# Patient Record
Sex: Female | Born: 1960 | Race: Black or African American | Hispanic: No
Health system: Western US, Academic
[De-identification: ages and names within clinical notes are randomized; demographics above are authoritative.]

## PROBLEM LIST (undated history)

## (undated) DIAGNOSIS — D649 Anemia, unspecified: Secondary | ICD-10-CM

## (undated) DIAGNOSIS — L209 Atopic dermatitis, unspecified: Secondary | ICD-10-CM

## (undated) DIAGNOSIS — K219 Gastro-esophageal reflux disease without esophagitis: Secondary | ICD-10-CM

## (undated) DIAGNOSIS — I10 Essential (primary) hypertension: Secondary | ICD-10-CM

## (undated) HISTORY — PX: TONSILLECTOMY: SUR1361

## (undated) HISTORY — DX: Gastro-esophageal reflux disease without esophagitis: K21.9

## (undated) HISTORY — DX: Anemia, unspecified: D64.9

## (undated) HISTORY — PX: COLONOSCOPY: SHX174

## (undated) HISTORY — DX: Essential (primary) hypertension: I10

## (undated) HISTORY — DX: Atopic dermatitis, unspecified: L20.9

## (undated) DEATH — deceased

---

## 1993-05-21 ENCOUNTER — Other Ambulatory Visit (HOSPITAL_BASED_OUTPATIENT_CLINIC_OR_DEPARTMENT_OTHER): Payer: Self-pay

## 1993-05-27 LAB — CERVICAL CANCER SCREENING

## 2001-05-22 ENCOUNTER — Ambulatory Visit (INDEPENDENT_AMBULATORY_CARE_PROVIDER_SITE_OTHER): Payer: Medicaid Other | Admitting: Oral and Maxillofacial Surgery

## 2001-06-06 ENCOUNTER — Encounter (INDEPENDENT_AMBULATORY_CARE_PROVIDER_SITE_OTHER): Payer: Medicaid Other

## 2001-10-26 ENCOUNTER — Encounter (HOSPITAL_BASED_OUTPATIENT_CLINIC_OR_DEPARTMENT_OTHER): Payer: Self-pay

## 2001-10-30 ENCOUNTER — Encounter (HOSPITAL_BASED_OUTPATIENT_CLINIC_OR_DEPARTMENT_OTHER): Payer: MEDICAID

## 2002-01-23 ENCOUNTER — Encounter (HOSPITAL_BASED_OUTPATIENT_CLINIC_OR_DEPARTMENT_OTHER): Payer: Self-pay | Admitting: Family

## 2003-05-21 ENCOUNTER — Ambulatory Visit (EMERGENCY_DEPARTMENT_HOSPITAL): Payer: No Typology Code available for payment source

## 2005-07-29 ENCOUNTER — Ambulatory Visit (EMERGENCY_DEPARTMENT_HOSPITAL): Payer: Medicaid Other

## 2005-08-15 ENCOUNTER — Ambulatory Visit (EMERGENCY_DEPARTMENT_HOSPITAL): Payer: Medicaid Other

## 2005-09-28 ENCOUNTER — Encounter (HOSPITAL_BASED_OUTPATIENT_CLINIC_OR_DEPARTMENT_OTHER): Payer: Medicaid Other | Admitting: Nurse Practitioner

## 2005-10-06 ENCOUNTER — Encounter (HOSPITAL_BASED_OUTPATIENT_CLINIC_OR_DEPARTMENT_OTHER): Payer: Medicaid Other | Admitting: Nurse Practitioner

## 2005-10-07 ENCOUNTER — Encounter (HOSPITAL_BASED_OUTPATIENT_CLINIC_OR_DEPARTMENT_OTHER): Payer: Self-pay | Admitting: Physician Assistant

## 2005-10-07 ENCOUNTER — Encounter (HOSPITAL_BASED_OUTPATIENT_CLINIC_OR_DEPARTMENT_OTHER): Payer: Self-pay | Admitting: Nurse Practitioner

## 2006-03-31 ENCOUNTER — Ambulatory Visit (EMERGENCY_DEPARTMENT_HOSPITAL)

## 2006-06-27 ENCOUNTER — Encounter (HOSPITAL_BASED_OUTPATIENT_CLINIC_OR_DEPARTMENT_OTHER): Payer: Medicaid Other | Admitting: Internal Medicine

## 2006-07-08 ENCOUNTER — Encounter (HOSPITAL_BASED_OUTPATIENT_CLINIC_OR_DEPARTMENT_OTHER): Payer: Self-pay | Admitting: Clinical

## 2006-07-26 ENCOUNTER — Ambulatory Visit (HOSPITAL_BASED_OUTPATIENT_CLINIC_OR_DEPARTMENT_OTHER): Payer: Medicaid Other | Admitting: Rehabilitative and Restorative Service Providers"

## 2006-08-02 ENCOUNTER — Encounter (HOSPITAL_BASED_OUTPATIENT_CLINIC_OR_DEPARTMENT_OTHER): Payer: Medicaid Other | Admitting: Rehabilitative and Restorative Service Providers"

## 2006-08-08 ENCOUNTER — Encounter (HOSPITAL_BASED_OUTPATIENT_CLINIC_OR_DEPARTMENT_OTHER): Payer: Medicaid Other | Admitting: Rehabilitative and Restorative Service Providers"

## 2006-08-17 ENCOUNTER — Encounter (HOSPITAL_BASED_OUTPATIENT_CLINIC_OR_DEPARTMENT_OTHER): Payer: Medicaid Other | Admitting: Rehabilitative and Restorative Service Providers"

## 2006-08-25 ENCOUNTER — Encounter (HOSPITAL_BASED_OUTPATIENT_CLINIC_OR_DEPARTMENT_OTHER): Payer: Medicaid Other | Admitting: Rehabilitative and Restorative Service Providers"

## 2006-09-07 ENCOUNTER — Encounter (HOSPITAL_BASED_OUTPATIENT_CLINIC_OR_DEPARTMENT_OTHER): Payer: Medicaid Other | Admitting: Internal Medicine

## 2006-10-28 ENCOUNTER — Encounter (HOSPITAL_BASED_OUTPATIENT_CLINIC_OR_DEPARTMENT_OTHER): Payer: Medicaid Other

## 2006-11-04 ENCOUNTER — Encounter (HOSPITAL_BASED_OUTPATIENT_CLINIC_OR_DEPARTMENT_OTHER): Payer: Self-pay | Admitting: Internal Medicine

## 2007-01-24 ENCOUNTER — Encounter (HOSPITAL_BASED_OUTPATIENT_CLINIC_OR_DEPARTMENT_OTHER): Payer: Medicaid Other | Admitting: Internal Medicine

## 2007-06-05 ENCOUNTER — Encounter (HOSPITAL_BASED_OUTPATIENT_CLINIC_OR_DEPARTMENT_OTHER): Payer: Medicaid Other | Admitting: Dermatology

## 2007-10-02 ENCOUNTER — Encounter (HOSPITAL_BASED_OUTPATIENT_CLINIC_OR_DEPARTMENT_OTHER): Payer: Medicaid Other | Admitting: Dermatology

## 2009-04-06 ENCOUNTER — Inpatient Hospital Stay (HOSPITAL_BASED_OUTPATIENT_CLINIC_OR_DEPARTMENT_OTHER): Payer: Medicaid Other

## 2009-04-06 ENCOUNTER — Inpatient Hospital Stay
Admission: EM | Admit: 2009-04-06 | Discharge: 2009-05-05 | Disposition: A | Discharge status: Other Acute Inpatient Hospital | Payer: Medicaid Other | Source: Other Acute Inpatient Hospital | Attending: Thoracic Surgery (Cardiothoracic Vascular Surgery) | Admitting: Thoracic Surgery (Cardiothoracic Vascular Surgery)

## 2009-04-06 ENCOUNTER — Inpatient Hospital Stay (HOSPITAL_COMMUNITY): Payer: Medicaid Other | Admitting: Cardiac Surgery

## 2009-04-06 DIAGNOSIS — I76 Septic arterial embolism: Secondary | ICD-10-CM | POA: Diagnosis not present

## 2009-04-06 DIAGNOSIS — J690 Pneumonitis due to inhalation of food and vomit: Secondary | ICD-10-CM | POA: Diagnosis not present

## 2009-04-06 DIAGNOSIS — I311 Chronic constrictive pericarditis: Secondary | ICD-10-CM | POA: Diagnosis present

## 2009-04-06 DIAGNOSIS — D62 Acute posthemorrhagic anemia: Secondary | ICD-10-CM | POA: Diagnosis not present

## 2009-04-06 DIAGNOSIS — I10 Essential (primary) hypertension: Secondary | ICD-10-CM | POA: Diagnosis present

## 2009-04-06 DIAGNOSIS — Z880 Allergy status to penicillin: Secondary | ICD-10-CM

## 2009-04-06 DIAGNOSIS — I319 Disease of pericardium, unspecified: Secondary | ICD-10-CM | POA: Diagnosis present

## 2009-04-06 DIAGNOSIS — J9600 Acute respiratory failure, unspecified whether with hypoxia or hypercapnia: Secondary | ICD-10-CM | POA: Diagnosis not present

## 2009-04-06 DIAGNOSIS — Z9911 Dependence on respirator [ventilator] status: Secondary | ICD-10-CM

## 2009-04-06 DIAGNOSIS — E46 Unspecified protein-calorie malnutrition: Secondary | ICD-10-CM | POA: Diagnosis present

## 2009-04-06 DIAGNOSIS — I776 Arteritis, unspecified: Secondary | ICD-10-CM | POA: Diagnosis present

## 2009-04-06 DIAGNOSIS — IMO0002 Reserved for concepts with insufficient information to code with codable children: Secondary | ICD-10-CM | POA: Diagnosis present

## 2009-04-06 DIAGNOSIS — F411 Generalized anxiety disorder: Secondary | ICD-10-CM | POA: Diagnosis present

## 2009-04-06 DIAGNOSIS — R091 Pleurisy: Secondary | ICD-10-CM | POA: Diagnosis present

## 2009-04-06 DIAGNOSIS — I308 Other forms of acute pericarditis: Secondary | ICD-10-CM | POA: Diagnosis present

## 2009-04-06 DIAGNOSIS — I71019 Dissection of thoracic aorta, unspecified: Principal | ICD-10-CM | POA: Diagnosis present

## 2009-04-06 DIAGNOSIS — F10931 Alcohol use, unspecified with withdrawal delirium: Secondary | ICD-10-CM | POA: Diagnosis present

## 2009-04-06 DIAGNOSIS — R0682 Tachypnea, not elsewhere classified: Secondary | ICD-10-CM | POA: Diagnosis not present

## 2009-04-06 DIAGNOSIS — I9589 Other hypotension: Secondary | ICD-10-CM | POA: Diagnosis not present

## 2009-04-08 ENCOUNTER — Inpatient Hospital Stay (HOSPITAL_BASED_OUTPATIENT_CLINIC_OR_DEPARTMENT_OTHER): Payer: Medicaid Other

## 2009-04-08 ENCOUNTER — Other Ambulatory Visit (HOSPITAL_BASED_OUTPATIENT_CLINIC_OR_DEPARTMENT_OTHER): Payer: Medicaid Other

## 2009-04-11 ENCOUNTER — Inpatient Hospital Stay (HOSPITAL_BASED_OUTPATIENT_CLINIC_OR_DEPARTMENT_OTHER): Payer: Medicaid Other

## 2009-04-11 ENCOUNTER — Other Ambulatory Visit (HOSPITAL_BASED_OUTPATIENT_CLINIC_OR_DEPARTMENT_OTHER): Payer: Self-pay | Admitting: Cardiac Surgery

## 2009-04-15 ENCOUNTER — Other Ambulatory Visit (HOSPITAL_BASED_OUTPATIENT_CLINIC_OR_DEPARTMENT_OTHER): Payer: Self-pay | Admitting: Cardiac Surgery

## 2009-04-17 ENCOUNTER — Other Ambulatory Visit (HOSPITAL_BASED_OUTPATIENT_CLINIC_OR_DEPARTMENT_OTHER): Payer: Self-pay | Admitting: Plastic and Reconstructive Surgery

## 2009-04-17 LAB — CHEST X-RAY 1 VW

## 2009-04-23 ENCOUNTER — Ambulatory Visit (HOSPITAL_BASED_OUTPATIENT_CLINIC_OR_DEPARTMENT_OTHER): Payer: Medicaid Other

## 2009-04-23 ENCOUNTER — Other Ambulatory Visit (HOSPITAL_BASED_OUTPATIENT_CLINIC_OR_DEPARTMENT_OTHER): Payer: Self-pay | Admitting: Plastic and Reconstructive Surgery

## 2009-04-24 ENCOUNTER — Inpatient Hospital Stay (HOSPITAL_BASED_OUTPATIENT_CLINIC_OR_DEPARTMENT_OTHER): Payer: Medicaid Other

## 2009-04-25 ENCOUNTER — Inpatient Hospital Stay (HOSPITAL_BASED_OUTPATIENT_CLINIC_OR_DEPARTMENT_OTHER): Payer: Medicaid Other

## 2009-04-29 ENCOUNTER — Inpatient Hospital Stay (HOSPITAL_BASED_OUTPATIENT_CLINIC_OR_DEPARTMENT_OTHER): Payer: Medicaid Other

## 2009-04-29 LAB — PR ANGIO DRAIN PLACEMENT

## 2009-04-30 ENCOUNTER — Inpatient Hospital Stay (HOSPITAL_BASED_OUTPATIENT_CLINIC_OR_DEPARTMENT_OTHER): Payer: Medicaid Other

## 2009-07-01 ENCOUNTER — Other Ambulatory Visit (HOSPITAL_BASED_OUTPATIENT_CLINIC_OR_DEPARTMENT_OTHER): Payer: Self-pay | Admitting: Thoracic Surgery (Cardiothoracic Vascular Surgery)

## 2009-07-01 ENCOUNTER — Ambulatory Visit: Payer: Medicaid Other | Attending: Thoracic Surgery (Cardiothoracic Vascular Surgery)

## 2009-07-01 DIAGNOSIS — I712 Thoracic aortic aneurysm, without rupture, unspecified: Secondary | ICD-10-CM | POA: Insufficient documentation

## 2009-07-02 ENCOUNTER — Encounter (HOSPITAL_BASED_OUTPATIENT_CLINIC_OR_DEPARTMENT_OTHER): Payer: Medicaid Other | Admitting: Thoracic Surgery (Cardiothoracic Vascular Surgery)

## 2009-07-02 ENCOUNTER — Ambulatory Visit
Payer: Medicaid Other | Attending: Thoracic Surgery (Cardiothoracic Vascular Surgery) | Admitting: Thoracic Surgery (Cardiothoracic Vascular Surgery)

## 2009-07-02 DIAGNOSIS — I712 Thoracic aortic aneurysm, without rupture, unspecified: Secondary | ICD-10-CM | POA: Insufficient documentation

## 2009-07-02 DIAGNOSIS — Z48812 Encounter for surgical aftercare following surgery on the circulatory system: Secondary | ICD-10-CM | POA: Insufficient documentation

## 2009-07-21 LAB — PR CT ANGIOGRAPHY CHEST W/CONTRAST/NONCONTRAST

## 2010-02-09 ENCOUNTER — Ambulatory Visit: Payer: Self-pay | Admitting: Internal Medicine

## 2010-11-09 ENCOUNTER — Ambulatory Visit: Payer: Medicaid Other

## 2010-11-11 ENCOUNTER — Encounter (HOSPITAL_BASED_OUTPATIENT_CLINIC_OR_DEPARTMENT_OTHER): Payer: Self-pay | Admitting: Thoracic Surgery (Cardiothoracic Vascular Surgery)

## 2010-11-27 ENCOUNTER — Other Ambulatory Visit (HOSPITAL_BASED_OUTPATIENT_CLINIC_OR_DEPARTMENT_OTHER): Payer: Medicaid Other

## 2010-12-02 ENCOUNTER — Encounter (HOSPITAL_BASED_OUTPATIENT_CLINIC_OR_DEPARTMENT_OTHER): Payer: Medicaid Other | Admitting: Thoracic Surgery (Cardiothoracic Vascular Surgery)

## 2010-12-16 ENCOUNTER — Other Ambulatory Visit (HOSPITAL_BASED_OUTPATIENT_CLINIC_OR_DEPARTMENT_OTHER): Payer: Medicaid Other

## 2010-12-18 ENCOUNTER — Other Ambulatory Visit (HOSPITAL_BASED_OUTPATIENT_CLINIC_OR_DEPARTMENT_OTHER): Payer: Medicaid Other

## 2010-12-25 ENCOUNTER — Other Ambulatory Visit (HOSPITAL_BASED_OUTPATIENT_CLINIC_OR_DEPARTMENT_OTHER): Payer: Self-pay | Admitting: Thoracic Surgery (Cardiothoracic Vascular Surgery)

## 2010-12-25 ENCOUNTER — Ambulatory Visit: Payer: Medicaid Other | Attending: Thoracic Surgery (Cardiothoracic Vascular Surgery)

## 2010-12-25 DIAGNOSIS — I719 Aortic aneurysm of unspecified site, without rupture: Secondary | ICD-10-CM

## 2010-12-25 LAB — PR MRA CHEST WO/W CONT

## 2010-12-30 ENCOUNTER — Encounter (HOSPITAL_BASED_OUTPATIENT_CLINIC_OR_DEPARTMENT_OTHER): Payer: Self-pay | Admitting: Thoracic Surgery (Cardiothoracic Vascular Surgery)

## 2011-01-06 ENCOUNTER — Encounter (HOSPITAL_BASED_OUTPATIENT_CLINIC_OR_DEPARTMENT_OTHER): Payer: Self-pay | Admitting: Thoracic Surgery (Cardiothoracic Vascular Surgery)

## 2011-01-20 ENCOUNTER — Ambulatory Visit
Payer: Medicaid Other | Attending: Thoracic Surgery (Cardiothoracic Vascular Surgery) | Admitting: Thoracic Surgery (Cardiothoracic Vascular Surgery)

## 2011-01-20 DIAGNOSIS — I712 Thoracic aortic aneurysm, without rupture, unspecified: Secondary | ICD-10-CM | POA: Insufficient documentation

## 2011-07-20 ENCOUNTER — Other Ambulatory Visit: Payer: Self-pay

## 2012-02-02 ENCOUNTER — Other Ambulatory Visit (HOSPITAL_BASED_OUTPATIENT_CLINIC_OR_DEPARTMENT_OTHER): Payer: Medicaid Other

## 2012-02-02 ENCOUNTER — Encounter (HOSPITAL_BASED_OUTPATIENT_CLINIC_OR_DEPARTMENT_OTHER): Payer: Medicaid Other | Admitting: Thoracic Surgery (Cardiothoracic Vascular Surgery)

## 2012-05-24 ENCOUNTER — Ambulatory Visit: Payer: Self-pay | Admitting: Family Medicine

## 2014-05-31 ENCOUNTER — Encounter (HOSPITAL_BASED_OUTPATIENT_CLINIC_OR_DEPARTMENT_OTHER): Payer: Self-pay | Admitting: Thoracic Surgery (Cardiothoracic Vascular Surgery)

## 2014-05-31 NOTE — Telephone Encounter (Signed)
This encounter was opened in error.

## 2014-08-29 ENCOUNTER — Ambulatory Visit: Payer: Self-pay | Admitting: Family Medicine

## 2015-07-14 IMAGING — US ABDOMEN ULTRASOUND
1 series · 14 of 25 positions shown · non-contrast
Comparison: None.

CLINICAL DATA: Right upper quadrant pain.  Nausea.

EXAM:
ULTRASOUND ABDOMEN COMPLETE

[Series 1: abdomen ultrasound · 0.30mm/px · 14 of 101 slices shown]
[im 1/101]
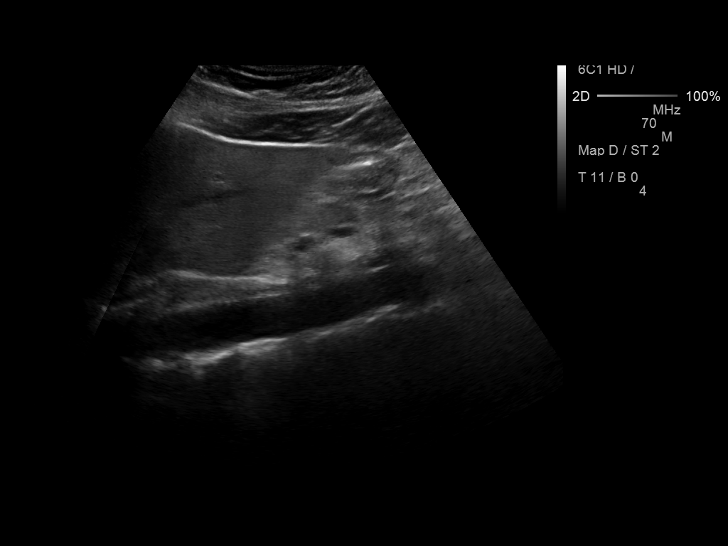
[im 9/101]
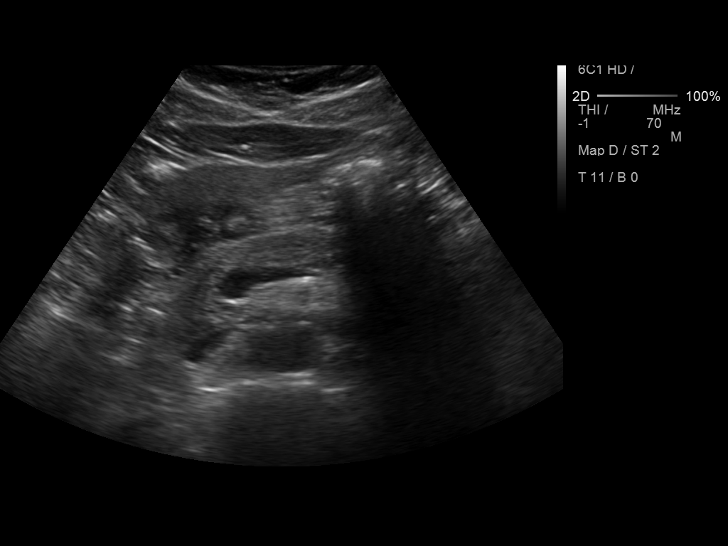
[im 17/101]
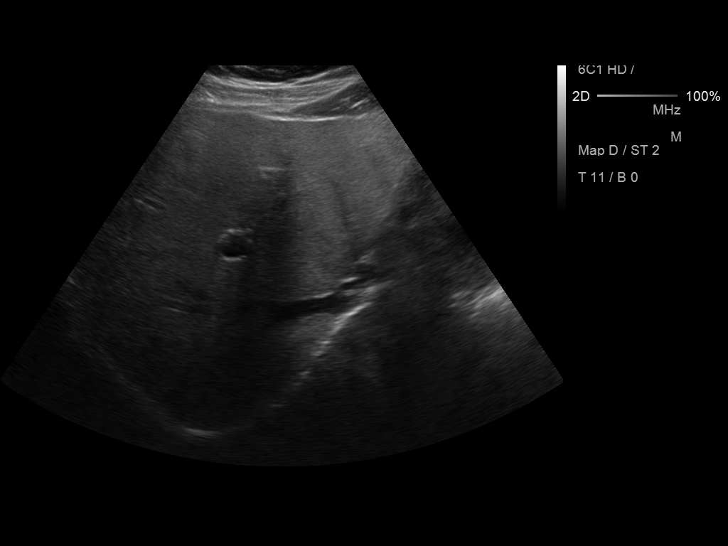
[im 26/101]
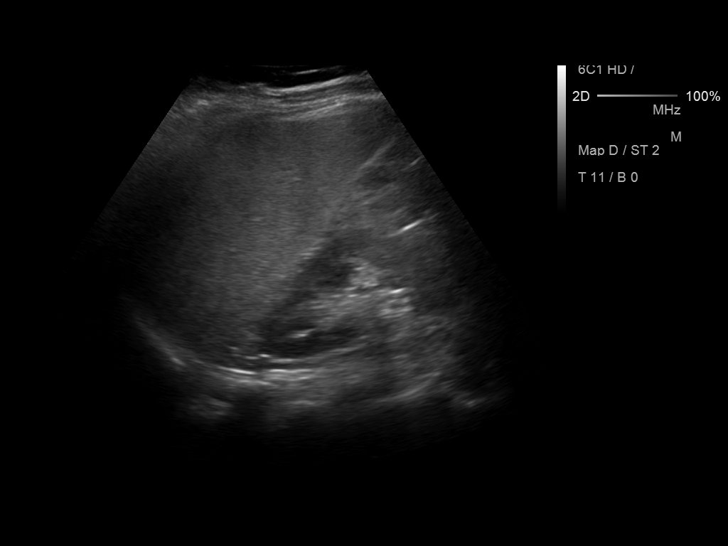
[im 34/101]
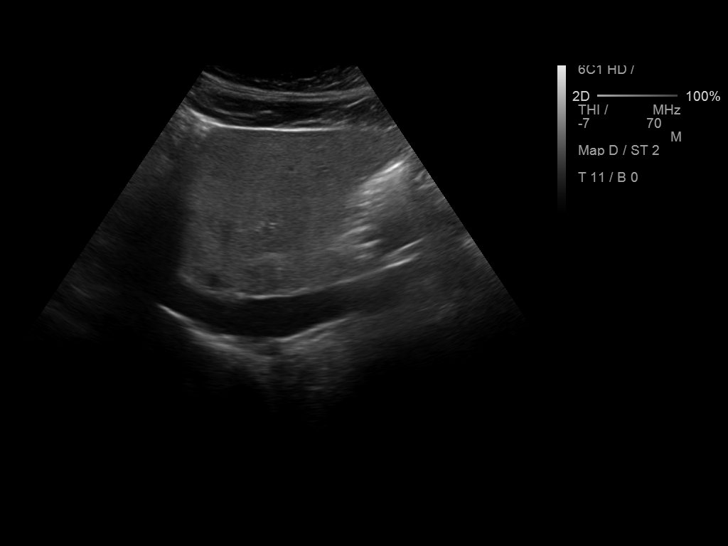
[im 38/101]
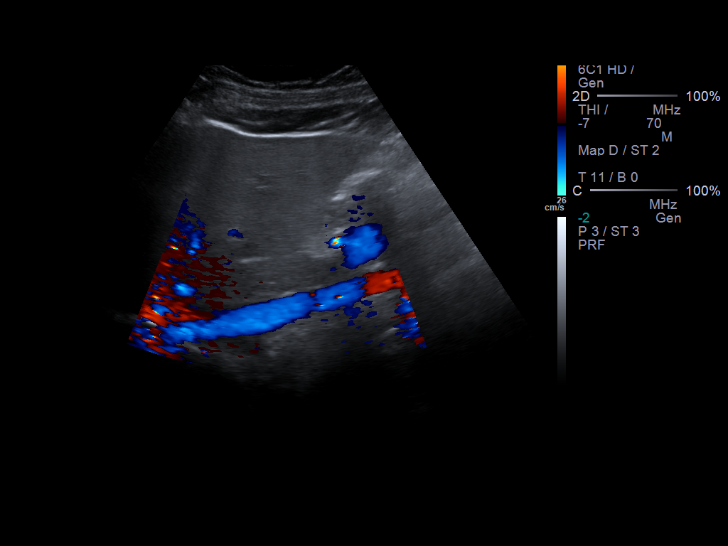
[im 46/101]
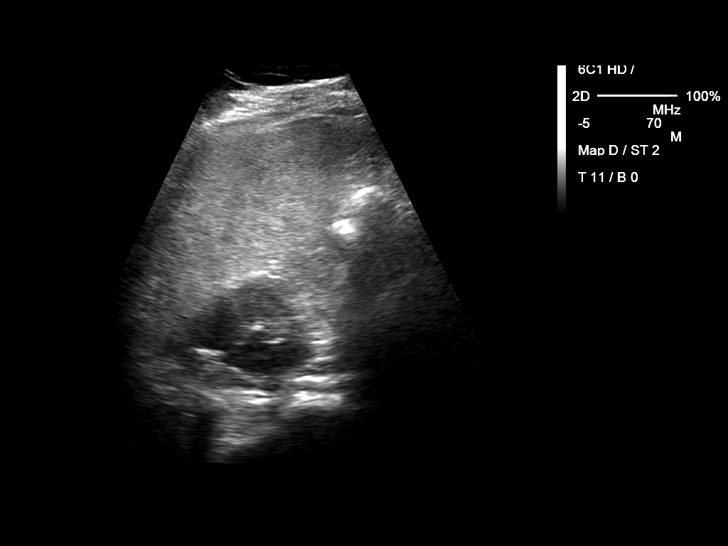
[im 55/101]
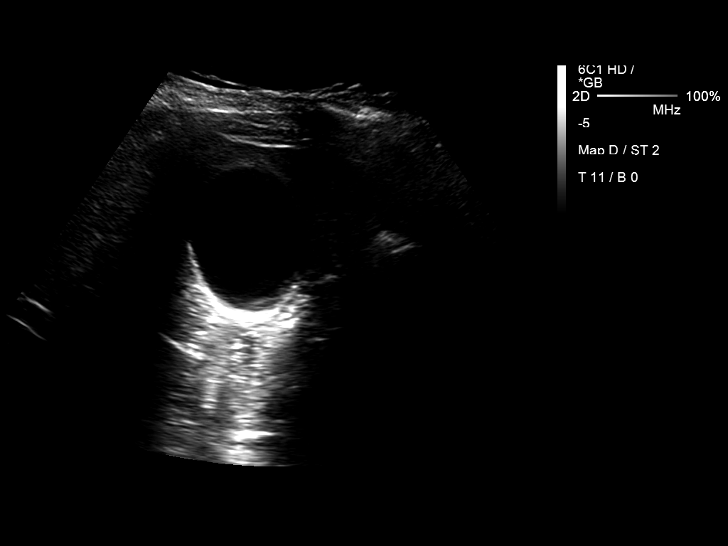
[im 63/101]
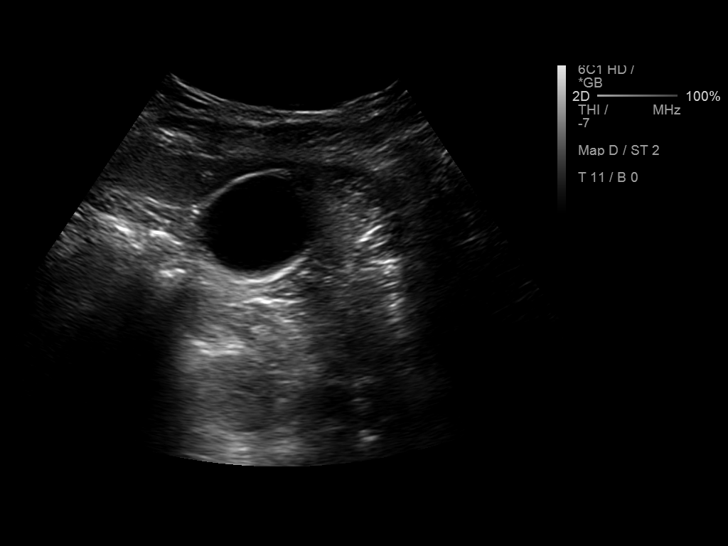
[im 67/101]
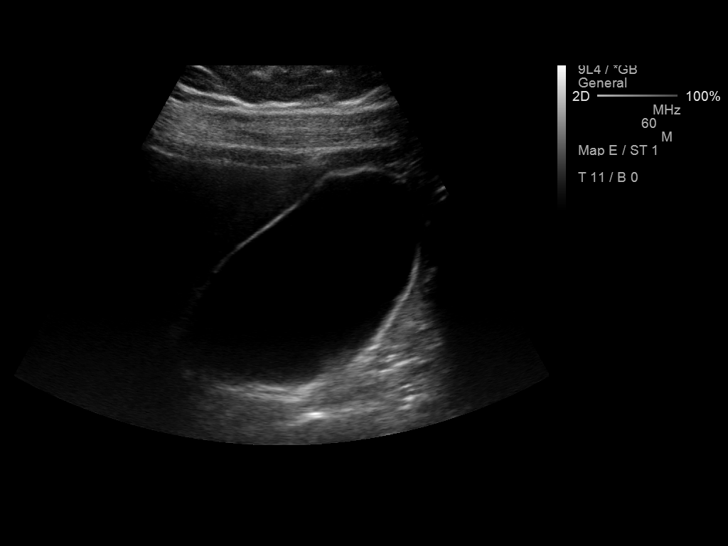
[im 76/101]
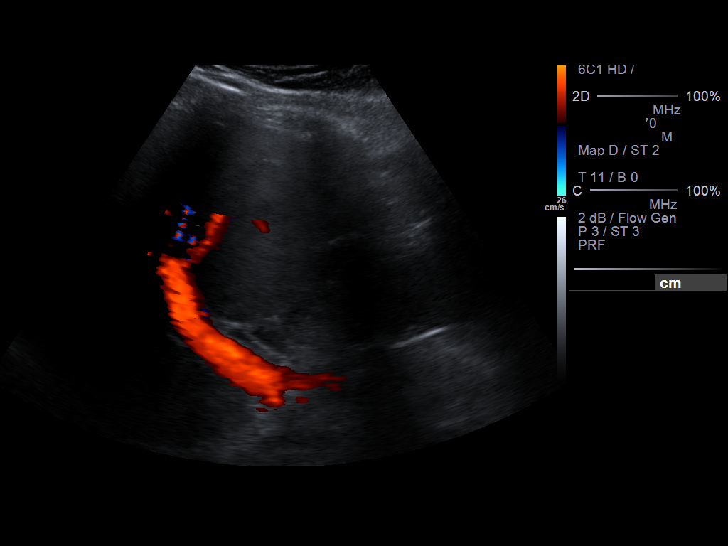
[im 84/101]
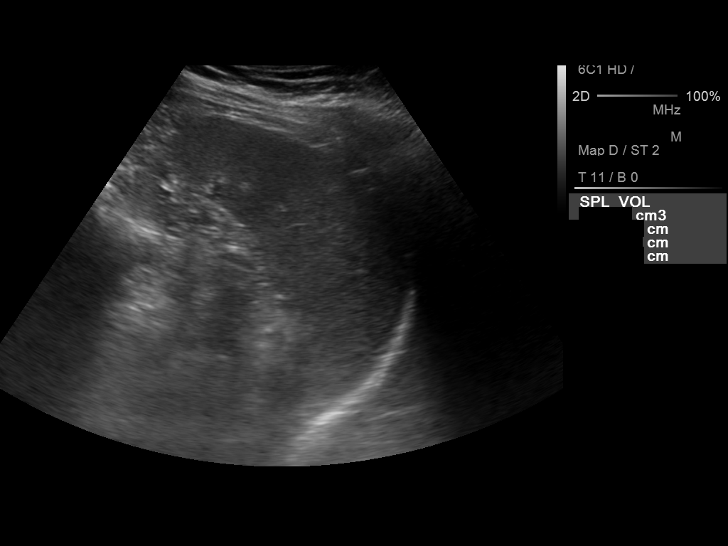
[im 92/101]
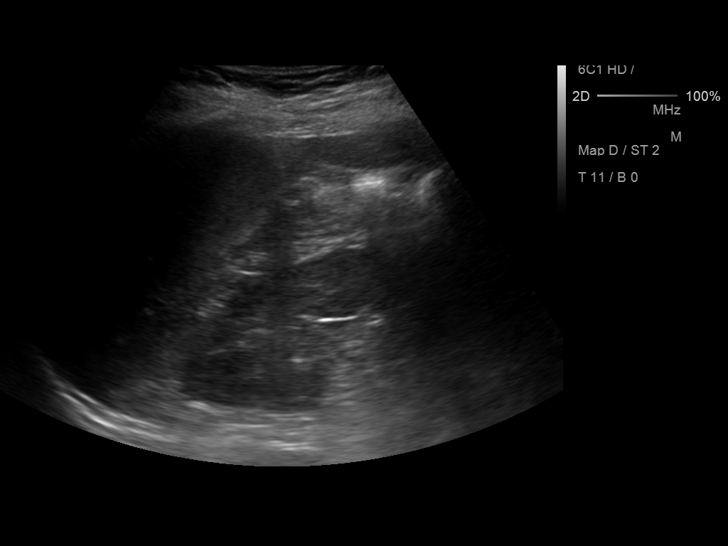
[im 101/101]
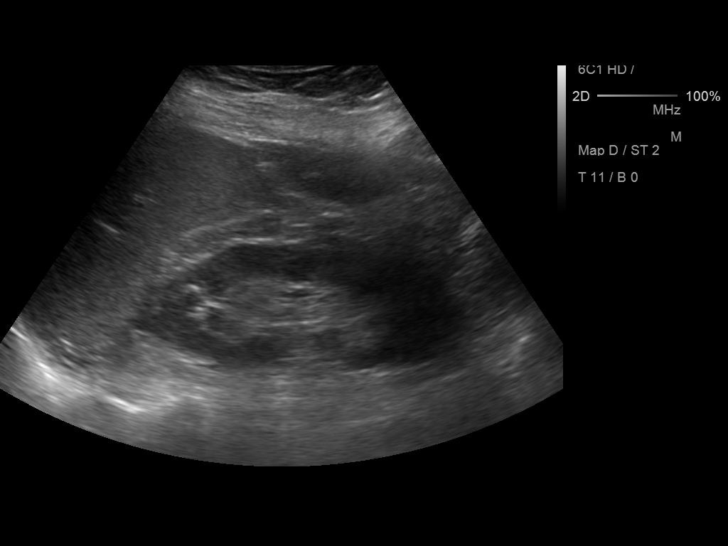

[14 of 25 positions shown; findings below may reference images not displayed]

FINDINGS: Gallbladder: No gallstones or wall thickening visualized. No
sonographic Murphy sign noted.

Common bile duct: Diameter: 4.0 mm.

Liver: Mild increased echogenicity suggesting fatty infiltration.

IVC: No abnormality visualized.

Pancreas: Poorly visualized.

Spleen: Size and appearance within normal limits.

Right Kidney: Length: 12.8 cm. Echogenicity within normal limits. No
mass or hydronephrosis visualized.

Left Kidney: Length: 11.4 cm. Echogenicity within normal limits. No
mass or hydronephrosis visualized.

Abdominal aorta: No aneurysm visualized.

Other findings: None.
IMPRESSION: Negative exam.

## 2017-08-23 DIAGNOSIS — D649 Anemia, unspecified: Secondary | ICD-10-CM

## 2017-08-23 HISTORY — DX: Anemia, unspecified: D64.9

## 2017-12-21 DEATH — deceased

## 2018-09-25 ENCOUNTER — Ambulatory Visit (INDEPENDENT_AMBULATORY_CARE_PROVIDER_SITE_OTHER): Payer: Self-pay | Admitting: Obstetrics and Gynecology

## 2018-09-25 ENCOUNTER — Encounter: Payer: Self-pay | Admitting: Obstetrics and Gynecology

## 2018-09-25 VITALS — BP 140/98 | Ht 62.0 in | Wt 184.0 lb

## 2018-09-25 DIAGNOSIS — Z124 Encounter for screening for malignant neoplasm of cervix: Secondary | ICD-10-CM

## 2018-09-25 DIAGNOSIS — Z01419 Encounter for gynecological examination (general) (routine) without abnormal findings: Secondary | ICD-10-CM

## 2018-09-25 DIAGNOSIS — Z1239 Encounter for other screening for malignant neoplasm of breast: Secondary | ICD-10-CM

## 2018-09-25 DIAGNOSIS — R6882 Decreased libido: Secondary | ICD-10-CM | POA: Insufficient documentation

## 2018-09-25 NOTE — Patient Instructions (Signed)
I value your feedback and entrusting us with your care. If you get a  patient survey, I would appreciate you taking the time to let us know about your experience today. Thank you! 

## 2018-09-25 NOTE — Progress Notes (Signed)
PCP: Patient, No Pcp Per   Chief Complaint  Patient presents with  . Gynecologic Exam   SELF PAY   HPI:      Beth Osborn is a 58 y.o. No obstetric history on file. who LMP was No LMP recorded., presents today for her NP> [age]yrs  annual examination.  Her menses are absent due to menopause.  She does not have postmenopausal bleeding. She does have vasomotor sx that are tolerable.   Sex activity: single partner, contraception - post menopausal status. She does not have vaginal dryness. Continues to have decreased libido.  Last Pap: November 11, 2014  Results were: no abnormalities /neg HPV DNA.  Hx of STDs: none  Last mammogram: 11/11/14 Results were: normal--routine follow-up in 12 months There is no FH of breast cancer. There is no FH of ovarian cancer. The patient does do self-breast exams.  Colonoscopy: colonoscopy 2019 without abnormalities.  Repeat due after 5 or 10 years (pt unsure). Pt has low iron and had neg colonoscopy/endoscopy. Taking iron supp. Followed by PCP.  Tobacco use: The patient denies current or previous tobacco use. Alcohol use: none Exercise: moderately active  She does get adequate calcium and Vitamin D in her diet.  Labs with PCP.   Past Medical History:  Diagnosis Date  . Anemia   . Anemia 2019   neg colonoscopy/endoscopy; followed by PCP  . Atopic dermatitis   . GERD (gastroesophageal reflux disease)   . Hypertension     Past Surgical History:  Procedure Laterality Date  . COLONOSCOPY    . TONSILLECTOMY      Family History  Problem Relation Age of Onset  . Heart disease Mother   . Hypertension Mother   . Heart disease Father   . Hypertension Father   . Diabetes Sister [id number]  . Diabetes Brother     Social History   Socioeconomic History  . Marital status: Married    Spouse name: Not on file  . Number of children: Not on file  . Years of education: Not on file  . Highest education level: Not on file  Occupational History  .  Not on file  Social Needs  . Financial resource strain: Not on file  . Food insecurity:    Worry: Not on file    Inability: Not on file  . Transportation needs:    Medical: Not on file    Non-medical: Not on file  Tobacco Use  . Smoking status: Never Smoker  . Smokeless tobacco: Never Used  Substance and Sexual Activity  . Alcohol use: Never    Frequency: Never  . Drug use: Never  . Sexual activity: Yes    Birth control/protection: None  Lifestyle  . Physical activity:    Days per week: Not on file    Minutes per session: Not on file  . Stress: Not on file  Relationships  . Social connections:    Talks on phone: Not on file    Gets together: Not on file    Attends religious service: Not on file    Active member of club or organization: Not on file    Attends meetings of clubs or organizations: Not on file    Relationship status: Not on file  . Intimate partner violence:    Fear of current or ex partner: Not on file    Emotionally abused: Not on file    Physically abused: Not on file    Forced sexual activity: Not  on file  Other Topics Concern  . Not on file  Social History Narrative  . Not on file    Outpatient Medications Prior to Visit  Medication Sig Dispense Refill  . lisinopril (PRINIVIL,ZESTRIL) 10 MG tablet Take by mouth.     No facility-administered medications prior to visit.      ROS:  Review of Systems  Constitutional: Positive for fatigue. Negative for fever and unexpected weight change.  Respiratory: Negative for cough, shortness of breath and wheezing.   Cardiovascular: Negative for chest pain, palpitations and leg swelling.  Gastrointestinal: Negative for blood in stool, constipation, diarrhea, nausea and vomiting.  Endocrine: Negative for cold intolerance, heat intolerance and polyuria.  Genitourinary: Negative for dyspareunia, dysuria, flank pain, frequency, genital sores, hematuria, menstrual problem, pelvic pain, urgency, vaginal bleeding,  vaginal discharge and vaginal pain.  Musculoskeletal: Negative for back pain, joint swelling and myalgias.  Skin: Negative for rash.  Neurological: Positive for dizziness. Negative for syncope, light-headedness, numbness and headaches.  Hematological: Negative for adenopathy.  Psychiatric/Behavioral: Negative for agitation, confusion, sleep disturbance and suicidal ideas. The patient is not nervous/anxious.    BREAST: No symptoms    Objective: BP (!) 140/98   Ht 5\' 2"  (1.575 m)   Wt 184 lb (83.5 kg)   BMI 33.65 kg/m    Physical Exam Constitutional:      Appearance: She is well-developed.  Genitourinary:     Vulva, vagina, cervix, uterus, right adnexa and left adnexa normal.     No vulval lesion or tenderness noted.     No vaginal discharge, erythema or tenderness.     No cervical polyp.     Uterus is not enlarged or tender.     No right or left adnexal mass present.     Right adnexa not tender.     Left adnexa not tender.  Neck:     Musculoskeletal: Normal range of motion.     Thyroid: No thyromegaly.  Cardiovascular:     Rate and Rhythm: Normal rate and regular rhythm.     Heart sounds: Normal heart sounds. No murmur.  Pulmonary:     Effort: Pulmonary effort is normal.     Breath sounds: Normal breath sounds.  Chest:     Breasts:        Right: No mass, nipple discharge, skin change or tenderness.        Left: No mass, nipple discharge, skin change or tenderness.  Abdominal:     Palpations: Abdomen is soft.     Tenderness: There is no abdominal tenderness. There is no guarding.  Musculoskeletal: Normal range of motion.  Neurological:     Mental Status: She is alert and oriented to person, place, and time.     Cranial Nerves: No cranial nerve deficit.  Psychiatric:        Behavior: Behavior normal.  Vitals signs reviewed.     Assessment/Plan:  Encounter for annual routine gynecological examination  Cervical cancer screening - MDL pap since self pay - Plan:  Other/Misc lab test  Screening for breast cancer - Pt to sched mammo. Can do [facility] if desires.  - Plan: MM 3D SCREEN BREAST BILATERAL  Decreased libido - Add DHEA 5 mg daily to see if help. Cont iron for fatigue.          GYN counsel breast self exam, mammography screening, menopause, adequate intake of calcium and vitamin D, diet and exercise    F/U  Return in about 1  year (around 09/26/2019).   B. , PA-C 09/25/2018 2:32 PM

## 2018-10-02 ENCOUNTER — Encounter: Payer: Self-pay | Admitting: Obstetrics and Gynecology

## 2021-04-16 ENCOUNTER — Encounter: Payer: Self-pay | Admitting: Obstetrics and Gynecology

## 2022-04-09 ENCOUNTER — Other Ambulatory Visit (HOSPITAL_COMMUNITY): Payer: Self-pay | Admitting: Family Medicine

## 2022-04-09 ENCOUNTER — Other Ambulatory Visit: Payer: Self-pay | Admitting: Family Medicine

## 2022-04-09 DIAGNOSIS — Z8249 Family history of ischemic heart disease and other diseases of the circulatory system: Secondary | ICD-10-CM

## 2022-04-15 ENCOUNTER — Ambulatory Visit
Admission: RE | Admit: 2022-04-15 | Discharge: 2022-04-15 | Disposition: A | Discharge status: Home / Self Care | Payer: Self-pay | Source: Ambulatory Visit | Attending: Family Medicine | Admitting: Family Medicine

## 2022-04-15 DIAGNOSIS — Z8249 Family history of ischemic heart disease and other diseases of the circulatory system: Secondary | ICD-10-CM | POA: Insufficient documentation

## 2023-03-29 LAB — CYTOPATHOLOGY NON-GYN

## 2023-06-27 NOTE — Progress Notes (Unsigned)
PCP: Patient, No Pcp Per   No chief complaint on file.  SELF PAY   HPI:      Ms. Beth Osborn is a 61 y.o. No obstetric history on file. who LMP was No LMP recorded., presents today for her NP> 3 yrs annual examination.  Her menses are absent due to menopause.  She does not have postmenopausal bleeding. She does have vasomotor sx that are tolerable.   Sex activity: single partner, contraception - post menopausal status. She does not have vaginal dryness. Continues to have decreased libido.  Last Pap: 2/30/20 Results were: no abnormalities /neg HPV DNA 2016.  Hx of STDs: none  Last mammogram: 09/26/18 at [facility]: Results were: normal--routine follow-up in 12 months There is no FH of breast cancer. There is no FH of ovarian cancer. The patient does do self-breast exams.  Colonoscopy: colonoscopy 2019 without abnormalities.  Repeat due after 5 or 10 years (pt unsure). Pt has low iron and had neg colonoscopy/endoscopy. Taking iron supp. Followed by PCP.  Tobacco use: The patient denies current or previous tobacco use. Alcohol use: none No drug use Exercise: moderately active  She does get adequate calcium and Vitamin D in her diet.  Labs with PCP.   Past Medical History:  Diagnosis Date   Anemia    Anemia 2019   neg colonoscopy/endoscopy; followed by PCP   Atopic dermatitis    GERD (gastroesophageal reflux disease)    Hypertension     Past Surgical History:  Procedure Laterality Date   COLONOSCOPY     TONSILLECTOMY      Family History  Problem Relation Age of Onset   Heart disease Mother    Hypertension Mother    Heart disease Father    Hypertension Father    Diabetes Sister 90   Diabetes Brother     Social History   Socioeconomic History   Marital status: Married    Spouse name: Not on file   Number of children: Not on file   Years of education: Not on file   Highest education level: Not on file  Occupational History   Not on file  Tobacco Use   Smoking  status: Never   Smokeless tobacco: Never  Vaping Use   Vaping status: Never Used  Substance and Sexual Activity   Alcohol use: Never   Drug use: Never   Sexual activity: Yes    Birth control/protection: None  Other Topics Concern   Not on file  Social History Narrative   Not on file   Social Determinants of Health   Financial Resource Strain: Not on file  Food Insecurity: Not on file  Transportation Needs: Not on file  Physical Activity: Not on file  Stress: Not on file  Social Connections: Not on file  Intimate Partner Violence: Not on file    Outpatient Medications Prior to Visit  Medication Sig Dispense Refill   lisinopril (PRINIVIL,ZESTRIL) 10 MG tablet Take by mouth.     No facility-administered medications prior to visit.     ROS:  Review of Systems  Constitutional:  Positive for fatigue. Negative for fever and unexpected weight change.  Respiratory:  Negative for cough, shortness of breath and wheezing.   Cardiovascular:  Negative for chest pain, palpitations and leg swelling.  Gastrointestinal:  Negative for blood in stool, constipation, diarrhea, nausea and vomiting.  Endocrine: Negative for cold intolerance, heat intolerance and polyuria.  Genitourinary:  Negative for dyspareunia, dysuria, flank pain, frequency, genital sores, hematuria,  menstrual problem, pelvic pain, urgency, vaginal bleeding, vaginal discharge and vaginal pain.  Musculoskeletal:  Negative for back pain, joint swelling and myalgias.  Skin:  Negative for rash.  Neurological:  Positive for dizziness. Negative for syncope, light-headedness, numbness and headaches.  Hematological:  Negative for adenopathy.  Psychiatric/Behavioral:  Negative for agitation, confusion, sleep disturbance and suicidal ideas. The patient is not nervous/anxious.    BREAST: No symptoms    Objective: There were no vitals taken for this visit.   Physical Exam Constitutional:      Appearance: She is  well-developed.  Genitourinary:     Vulva normal.     No vaginal discharge, erythema or tenderness.      Right Adnexa: not tender and no mass present.    Left Adnexa: not tender and no mass present.    No cervical polyp.     Uterus is not enlarged or tender.  Breasts:    Right: No mass, nipple discharge, skin change or tenderness.     Left: No mass, nipple discharge, skin change or tenderness.  Neck:     Thyroid: No thyromegaly.  Cardiovascular:     Rate and Rhythm: Normal rate and regular rhythm.     Heart sounds: Normal heart sounds. No murmur heard. Pulmonary:     Effort: Pulmonary effort is normal.     Breath sounds: Normal breath sounds.  Abdominal:     Palpations: Abdomen is soft.     Tenderness: There is no abdominal tenderness. There is no guarding.  Musculoskeletal:        General: Normal range of motion.     Cervical back: Normal range of motion.  Neurological:     Mental Status: She is alert and oriented to person, place, and time.     Cranial Nerves: No cranial nerve deficit.  Psychiatric:        Behavior: Behavior normal.  Vitals reviewed.     Assessment/Plan:  No diagnosis found.          GYN counsel breast self exam, mammography screening, menopause, adequate intake of calcium and vitamin D, diet and exercise    F/U  No follow-ups on file.  Dhani Imel B. Shauntia Levengood, PA-C 06/27/2023 5:29 PM

## 2023-06-28 ENCOUNTER — Ambulatory Visit (INDEPENDENT_AMBULATORY_CARE_PROVIDER_SITE_OTHER): Payer: Self-pay | Admitting: Obstetrics and Gynecology

## 2023-06-28 ENCOUNTER — Encounter: Payer: Self-pay | Admitting: Obstetrics and Gynecology

## 2023-06-28 VITALS — BP 132/84 | Ht 62.0 in | Wt 188.0 lb

## 2023-06-28 DIAGNOSIS — Z1211 Encounter for screening for malignant neoplasm of colon: Secondary | ICD-10-CM

## 2023-06-28 DIAGNOSIS — Z01419 Encounter for gynecological examination (general) (routine) without abnormal findings: Secondary | ICD-10-CM

## 2023-06-28 DIAGNOSIS — L900 Lichen sclerosus et atrophicus: Secondary | ICD-10-CM

## 2023-06-28 DIAGNOSIS — Z1151 Encounter for screening for human papillomavirus (HPV): Secondary | ICD-10-CM

## 2023-06-28 DIAGNOSIS — Z1231 Encounter for screening mammogram for malignant neoplasm of breast: Secondary | ICD-10-CM

## 2023-06-28 DIAGNOSIS — Z124 Encounter for screening for malignant neoplasm of cervix: Secondary | ICD-10-CM

## 2023-06-28 LAB — RESULTS CONSOLE HPV: CHL HPV: NEGATIVE

## 2023-06-28 LAB — HM PAP SMEAR

## 2023-06-28 MED ORDER — CLOBETASOL PROPIONATE 0.05 % EX OINT: TOPICAL_OINTMENT | CUTANEOUS | 0 refills | Status: DC

## 2023-06-28 NOTE — Patient Instructions (Signed)
I value your feedback and you entrusting us with your care. If you get a [facility] patient survey, I would appreciate you taking the time to let us know about your experience today. Thank you!   [facility]: [phone]  

## 2023-06-30 ENCOUNTER — Encounter: Payer: Self-pay | Admitting: Obstetrics and Gynecology

## 2023-06-30 LAB — HM MAMMOGRAPHY

## 2023-07-05 ENCOUNTER — Encounter: Payer: Self-pay | Admitting: Obstetrics and Gynecology

## 2023-07-14 LAB — PATHOLOGY, SURGICAL

## 2023-07-15 NOTE — Telephone Encounter (Signed)
Pt aware.

## 2023-07-25 NOTE — Progress Notes (Unsigned)
Patient, No Pcp Per   No chief complaint on file.   HPI:      Ms. Beth Osborn is a 62 y.o. [id number] whose LMP was No LMP recorded. Patient is postmenopausal., presents today for f/u LS on exam (no bx done due to self pay). Started on clobetasol at bedtime for 4 wks  Lichen sclerosus - Plan: clobetasol ointment (TEMOVATE) 0.05 %; on clinical exam; given self pay, will treat with Rx clobetasol at bedtime for 4 wks and forego bx for now. RTO in 4 wks for f/u; if still abn, will do bx.   Patient Active Problem List   Diagnosis Date Noted   Decreased libido 09/25/2018    Past Surgical History:  Procedure Laterality Date   COLONOSCOPY     TONSILLECTOMY      Family History  Problem Relation Age of Onset   Heart disease Mother    Hypertension Mother    Fibroids Mother    Diabetes Father    Heart disease Father    Hypertension Father    Diabetes Sister 43   Heart attack Sister    Other Sister        Thyroid issues, removed.    Social History   Socioeconomic History   Marital status: Married    Spouse name: Not on file   Number of children: Not on file   Years of education: Not on file   Highest education level: Not on file  Occupational History   Not on file  Tobacco Use   Smoking status: Never   Smokeless tobacco: Never  Vaping Use   Vaping status: Never Used  Substance and Sexual Activity   Alcohol use: Yes    Comment: rare   Drug use: Never   Sexual activity: Yes    Birth control/protection: None, Post-menopausal  Other Topics Concern   Not on file  Social History Narrative   Not on file   Social Determinants of Health   Financial Resource Strain: Not on file  Food Insecurity: Not on file  Transportation Needs: Not on file  Physical Activity: Not on file  Stress: Not on file  Social Connections: Not on file  Intimate Partner Violence: Not on file    Outpatient Medications Prior to Visit  Medication Sig Dispense Refill   acyclovir (ZOVIRAX) 400  MG tablet Take by mouth.     B Complex Vitamins (VITAMIN B COMPLEX PO) Take 1 tablet by mouth daily.     cetirizine (ZYRTEC) 5 MG tablet Take by mouth.     clobetasol ointment (TEMOVATE) 0.05 % Apply to affected area every night for 4 weeks, then every other day for 4 weeks and then twice a week for 4 weeks or until resolution. 30 g 0   Echinacea 125 MG TABS Take by mouth.     lisinopril (PRINIVIL,ZESTRIL) 10 MG tablet Take by mouth.     magnesium oxide (MAG-OX) 400 MG tablet Take by mouth.     omeprazole (PRILOSEC OTC) 20 MG tablet Take by mouth.     Probiotic Product (PROBIOTIC PO) Take by mouth.     No facility-administered medications prior to visit.      ROS:  Review of Systems BREAST: No symptoms   OBJECTIVE:   Vitals:  There were no vitals taken for this visit.  Physical Exam  Results: No results found for this or any previous visit (from the past 24 hour(s)).   Assessment/Plan: No diagnosis found.  No orders of the defined types were placed in this encounter.     No follow-ups on file.  Janice Bodine B. Kohle Winner, PA-C 07/25/2023 3:04 PM

## 2023-07-26 ENCOUNTER — Encounter: Payer: Self-pay | Admitting: Obstetrics and Gynecology

## 2023-07-26 ENCOUNTER — Ambulatory Visit (INDEPENDENT_AMBULATORY_CARE_PROVIDER_SITE_OTHER): Payer: Self-pay | Admitting: Obstetrics and Gynecology

## 2023-07-26 DIAGNOSIS — L900 Lichen sclerosus et atrophicus: Secondary | ICD-10-CM

## 2023-07-26 MED ORDER — CLOBETASOL PROPIONATE 0.05 % EX OINT: TOPICAL_OINTMENT | CUTANEOUS | 0 refills | Status: AC

## 2023-07-26 NOTE — Patient Instructions (Signed)
I value your feedback and you entrusting us with your care. If you get a [facility] patient survey, I would appreciate you taking the time to let us know about your experience today. Thank you! ? ? ?
